# Patient Record
Sex: Female | Born: 1970 | Race: Black or African American | Hispanic: No | Marital: Married | Smoking: Never smoker
Health system: Southern US, Community
[De-identification: ages and names within clinical notes are randomized; demographics above are authoritative.]

---

## 2015-09-22 ENCOUNTER — Emergency Department (HOSPITAL_COMMUNITY): Payer: Self-pay

## 2015-09-22 ENCOUNTER — Emergency Department (HOSPITAL_COMMUNITY)
Admission: EM | Admit: 2015-09-22 | Discharge: 2015-09-22 | Disposition: A | Discharge status: Home / Self Care | Payer: Self-pay | Attending: Emergency Medicine | Admitting: Emergency Medicine

## 2015-09-22 ENCOUNTER — Encounter (HOSPITAL_COMMUNITY): Payer: Self-pay | Admitting: Emergency Medicine

## 2015-09-22 DIAGNOSIS — Y998 Other external cause status: Secondary | ICD-10-CM | POA: Insufficient documentation

## 2015-09-22 DIAGNOSIS — S9304XA Dislocation of right ankle joint, initial encounter: Secondary | ICD-10-CM | POA: Insufficient documentation

## 2015-09-22 DIAGNOSIS — Y9389 Activity, other specified: Secondary | ICD-10-CM | POA: Insufficient documentation

## 2015-09-22 DIAGNOSIS — S82891A Other fracture of right lower leg, initial encounter for closed fracture: Secondary | ICD-10-CM

## 2015-09-22 DIAGNOSIS — W11XXXA Fall on and from ladder, initial encounter: Secondary | ICD-10-CM | POA: Insufficient documentation

## 2015-09-22 DIAGNOSIS — Y9289 Other specified places as the place of occurrence of the external cause: Secondary | ICD-10-CM | POA: Insufficient documentation

## 2015-09-22 DIAGNOSIS — R52 Pain, unspecified: Secondary | ICD-10-CM

## 2015-09-22 MED ORDER — FENTANYL CITRATE (PF) 100 MCG/2ML IJ SOLN: 50.0000 ug | Freq: Once | INTRAMUSCULAR | Status: DC

## 2015-09-22 MED ORDER — FENTANYL CITRATE (PF) 100 MCG/2ML IJ SOLN
100.0000 ug | Freq: Once | INTRAMUSCULAR | Status: AC
Start: 2015-09-22 — End: 2015-09-22
  Administered 2015-09-22: 100 ug via INTRAVENOUS

## 2015-09-22 MED ORDER — OXYCODONE-ACETAMINOPHEN 5-325 MG PO TABS: 1.0000 | ORAL_TABLET | ORAL | Status: AC | PRN

## 2015-09-22 MED ORDER — FENTANYL CITRATE (PF) 100 MCG/2ML IJ SOLN
INTRAMUSCULAR | Status: AC
  Filled 2015-09-22: qty 2

## 2015-09-22 MED ORDER — HYDROMORPHONE HCL 1 MG/ML IJ SOLN
INTRAMUSCULAR | Status: AC
  Administered 2015-09-22: 1 mg via INTRAVENOUS
  Filled 2015-09-22: qty 1

## 2015-09-22 MED ORDER — KETAMINE HCL 10 MG/ML IJ SOLN
40.0000 mg | Freq: Once | INTRAMUSCULAR | Status: AC
  Administered 2015-09-22: 40 mg via INTRAVENOUS

## 2015-09-22 MED ORDER — ONDANSETRON HCL 4 MG/2ML IJ SOLN
4.0000 mg | Freq: Once | INTRAMUSCULAR | Status: AC
Start: 2015-09-22 — End: 2015-09-22
  Administered 2015-09-22: 4 mg via INTRAVENOUS
  Filled 2015-09-22: qty 2

## 2015-09-22 MED ORDER — HYDROMORPHONE HCL 1 MG/ML IJ SOLN
1.0000 mg | Freq: Once | INTRAMUSCULAR | Status: AC
  Administered 2015-09-22: 1 mg via INTRAVENOUS

## 2015-09-22 MED ORDER — PROPOFOL 10 MG/ML IV BOLUS
50.0000 mg | Freq: Once | INTRAVENOUS | Status: AC
  Administered 2015-09-22: 50 mg via INTRAVENOUS
  Filled 2015-09-22: qty 1

## 2015-09-22 NOTE — ED Notes (Addendum)
Pt had a brief moment of apnea.  Ventilation assisted w/ ambu-bag.

## 2015-09-22 NOTE — ED Notes (Signed)
Bed: [id number] Expected date:  Expected time:  Means of arrival:  Comments: hgb low

## 2015-09-22 NOTE — ED Notes (Signed)
Patient d/c'd to home.  F/U and medications reviewed.  Patient verbalized understanding.

## 2015-09-22 NOTE — ED Notes (Signed)
Patient given water to drink.  

## 2015-09-22 NOTE — ED Notes (Signed)
Ketamine given to [place], [employer].

## 2015-09-22 NOTE — ED Provider Notes (Signed)
CSN: [id number]     Arrival date & time 09/22/15  1443 History   First MD Initiated Contact with Patient 09/22/15 1455     Chief Complaint  Patient presents with  . 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 Just prior to arrival, the patient was 3 steps up on a stepladder and fell back, landing on her right ankle. She had a sudden onset of right ankle pain associated with obvious right ankle deformity. She endorses normal sensation of her right foot. She denies hitting her head or sustaining any other injuries. Her pain is severe, constant, and worse with any movement.  Patient is a 44 y.o. female presenting with lower extremity injury. The history is provided by the patient.  Ankle Injury    History reviewed. No pertinent past medical history. History reviewed. No pertinent past surgical history. No family history on file. Social History  Substance Use Topics  . Smoking status: None  . Smokeless tobacco: None  . Alcohol Use: None   OB History    No data available     Review of Systems 10 Systems reviewed and are negative for acute change except as noted in the HPI.    Allergies  Review of patient's allergies indicates no known allergies.  Home Medications   Prior to Admission medications   Medication Sig Start Date End Date Taking? Authorizing Provider  ibuprofen (ADVIL,MOTRIN) 200 MG tablet Take 400 mg by mouth every 6 (six) hours as needed for headache, mild pain or moderate pain.   Yes Historical Provider, MD  oxyCODONE-acetaminophen (PERCOCET) 5-325 MG tablet Take 1-2 tablets by mouth every 4 (four) hours as needed. 09/22/15   Ambrose Finland Rape, MD   BP 161/94 mmHg  Pulse 84  Temp(Src) 97.9 F (36.6 C) (Oral)  Resp 19  Ht  (1.6 m)  Wt 169 lb (76.658 kg)  BMI 29.94 kg/m2  SpO2 99%  LMP  09/15/2015 (Approximate) Physical Exam  Constitutional: She is oriented to person, place, and time. She appears well-developed and well-nourished.  In distress due to pain  HENT:  Head: Normocephalic and atraumatic.  Moist mucous membranes  Eyes: Conjunctivae are normal. Pupils are equal, round, and reactive to light.  Neck: Neck supple.  Cardiovascular: Normal rate, regular rhythm and normal heart sounds.   No murmur heard. Pulmonary/Chest: Effort normal and breath sounds normal.  Abdominal: Soft. Bowel sounds are normal. She exhibits no distension. There is no tenderness.  Musculoskeletal:  R foot deviated laterally in comparison to R leg w/ closed dislocation; 2+ pedal pulses; normal sensation b/l feet  Neurological: She is alert and oriented to person, place, and time.  Fluent speech  Skin: Skin is warm and dry. No pallor.  Psychiatric: She has a normal mood and affect. Judgment normal.  Nursing note and vitals reviewed.   ED Course  .Sedation Date/Time: 09/22/2015 4:26 PM Performed by: Laurence Spates Authorized by: Laurence Spates  Consent:    Consent obtained:  Written   Consent given by:  Patient   Risks discussed:  Allergic reaction, inadequate sedation, respiratory compromise necessitating ventilatory assistance and intubation, prolonged hypoxia resulting in organ damage and prolonged sedation necessitating reversal   Alternatives discussed:  Analgesia without sedation Universal protocol:    Procedure explained and questions answered to patient or proxy's satisfaction: yes     Relevant documents present and verified:  yes     Imaging studies available: yes     Site/side marked: yes     Immediately prior to procedure a time out was called: yes     Patient identity confirmation method:  Arm band and verbally with patient Indications:    Sedation purpose:  Dislocation reduction   Procedure necessitating sedation performed by:  Physician performing sedation    Intended level of sedation:  Moderate (conscious sedation) Pre-sedation assessment:    NPO status caution: urgency dictates proceeding with non-ideal NPO status     ASA classification: class 1 - normal, healthy patient     Neck mobility: normal     Mouth opening:  3 or more finger widths   Thyromental distance:  4 finger widths   Mallampati score:  II - soft palate, uvula, fauces visible   Pre-sedation assessments completed and reviewed: airway patency not reviewed, mental status not reviewed and nausea/vomiting status not reviewed   Immediate pre-procedure details:    Reassessment: Patient reassessed immediately prior to procedure     Reviewed: vital signs     Verified: bag valve mask available, emergency equipment available, intubation equipment available, IV patency confirmed, oxygen available and suction available   Procedure details (see MAR for exact dosages):    Preoxygenation:  Nasal cannula   Sedation:  Ketamine and propofol   Intra-procedure monitoring:  Blood pressure monitoring, cardiac monitor, continuous capnometry and continuous pulse oximetry   Intra-procedure events: hypoxia and respiratory depression     Intra-procedure management:  Airway repositioning, BVM ventilation and supplemental oxygen   Sedation end time:  09/22/2015 4:35 PM   Total sedation time (minutes):  10 Post-procedure details:    Post-sedation assessment completed:  09/22/2015 4:41 PM   Attendance: Constant attendance by certified staff until patient recovered     Recovery: Patient returned to pre-procedure baseline     Post-sedation assessments completed and reviewed: airway patency not reviewed and cardiovascular function not reviewed     Patient is stable for discharge or admission: Yes     Patient tolerance:  Tolerated well, no immediate complications Comments:     After reduction, patient had period of apnea followed by hypoxia requiring assisted ventilation by BVM for 2-3 minutes, after which  patient spontaneously awoke and began taking spontaneous respirations  Reduction of dislocation Date/Time: 4:30pm Performed by: Whitney Plunkett and Rachel Kidane Authorized by: Ambrose Finland Fortune Consent: written consent obtained. Risks and benefits: risks, benefits and alternatives were discussed Consent given by: patient Required items: required blood products, implants, devices, and special equipment available Time out: Immediately prior to procedure a "time out" was called to verify the correct patient, procedure, equipment, support staff and site/side marked as required.  Patient sedated: Ketamine and propofol  Vitals: Vital signs were monitored during sedation. Patient tolerance: Patient tolerated the procedure well with no immediate complications. Joint: left ankle Reduction technique: traction/countertraction Pulses present and normal sensation confirmed post-reduction.     (including critical care time) Labs Review Labs Reviewed - No data to display  Imaging Review Dg Knee 1-2 Views Right  09/22/2015  CLINICAL DATA:  Fall.  Initial evaluation. EXAM: RIGHT KNEE - 1-2 VIEW COMPARISON:  No prior. FINDINGS: No acute bony or joint abnormality identified. No evidence fracture or dislocation. IMPRESSION: No acute abnormality. Electronically Signed   By: Maisie Fus  Register   On: 09/22/2015 15:47   Dg Tibia/fibula Right  09/22/2015  CLINICAL DATA:  Pt was at work on a ladder and fell, twisting right  ankle. Pt complains of pain from knee to foot. Pt has a obvious deformity. Right knee looks AP and lower leg is lateral. Best images obtained due to pt condition. Shielded pt. EXAM: RIGHT TIBIA AND FIBULA - 2 VIEW COMPARISON:  Right foot and ankle same day FINDINGS: There is an oblique fracture of the distal fibula associated with dislocation of the ankle. The talus is posteriorly dislocated relative to the distal tibia. No definite fracture of the tibia identified. There is rotational  component of the dislocation. The inner osseous membrane is likely disrupted. IMPRESSION: Fracture dislocation of the ankle. Electronically Signed   By: Norva Pavlov M.D.   On: 09/22/2015 15:46   Dg Ankle 2 Views Right  09/22/2015  CLINICAL DATA:  Fall from ladder, twisting right ankle.  Deformity. EXAM: RIGHT ANKLE - 2 VIEW COMPARISON:  None. FINDINGS: There is an oblique fracture through the distal right fibula. Dislocation of the tibia anteriorly relative to the talus. No visible tibial fracture. Soft tissue swelling. IMPRESSION: Distal fibular fracture.  Tibiotalar dislocation. Electronically Signed   By: Charlett Nose M.D.   On: 09/22/2015 15:45   Dg Ankle Right Port  09/22/2015  CLINICAL DATA:  Postreduction right ankle. EXAM: PORTABLE RIGHT ANKLE - 2 VIEW COMPARISON:  09/22/2015 FINDINGS: Patient has undergone reduction of the right ankle. There is widening of the medial malleolus. Again noted is the lateral malleolar fracture. No evidence for fracture of the tibia. IMPRESSION: 1. Interval reduction of fracture dislocation. 2. Ankle mortise is widened along the medial aspect. Electronically Signed   By: Norva Pavlov M.D.   On: 09/22/2015 17:08   Dg Foot 2 Views Right  09/22/2015  CLINICAL DATA:  Pt was at work on a ladder and fell, twisting right ankle. Pt complains of pain from knee to foot. Pt has a obvious deformity. Right knee looks AP and lower leg is lateral. Best images obtained due to pt condition. Shielded pt. EXAM: RIGHT FOOT - 2 VIEW COMPARISON:  None. FINDINGS: Two views are performed, showing fracture dislocation of the ankle. Lateral malleolus is obliquely fractured. There is dislocation of the tibiotalar joint fell obvious fracture of the distal tibia. Positioning of foot is nonstandard because of deformity. There is no evidence for fracture of the foot. IMPRESSION: Fracture dislocation of the ankle. Electronically Signed   By: Norva Pavlov M.D.   On: 09/22/2015 15:44   I  have personally reviewed and evaluated these images and lab results as part of my medical decision-making.   EKG Interpretation None     Medications  ondansetron (ZOFRAN) injection 4 mg (4 mg Intravenous Given 09/22/15 1505)  HYDROmorphone (DILAUDID) injection 1 mg (1 mg Intravenous Given 09/22/15 1505)  ketamine (KETALAR) injection 40 mg (40 mg Intravenous Given by Other 09/22/15 1628)  propofol (DIPRIVAN) 10 mg/mL bolus/IV push 50 mg (0 mg Intravenous Stopped 09/22/15 1630)  fentaNYL (SUBLIMAZE) injection 100 mcg (100 mcg Intravenous Given 09/22/15 1625)     MDM   Final diagnoses:  Fracture dislocation of right ankle joint, closed, initial encounter   43yo F w/ R ankle injury from fall off stepladder. Pt arrived in moderate distress w/ obvious R ankle deformity suggestive of closed dislocation. Gave the patient Dilaudid and Zofran and obtained plain films which confirmed tibiotalar dislocation with distal fibular fracture. Performed reduction at bedside under procedural sedation; see procedure note for details. The patient had a 2-3 minute period of apnea requiring assisted ventilation with BVM after which she began breathing  spontaneously. Obtained postreduction films which showed distal fibula fx but appropriate relocation. I spoke with the orthopedic surgeon on call, Dr. Lequita HaltAluisio, who recommended 2-3 day follow up. Pt neurovascularly intact after reduction. I discussed care instructions w/ patient and her boyfriend including elevation, pain control, and strict non-weightbearing. Reviewed return precautions including signs of neurovascular compromise. Pt to see orthopaedics in 2-3 days. She voiced understanding and was discharged in satisfactory condition.  Laurence Spatesachel Morgan Geraldo, MD 09/23/15 [phone]

## 2015-09-22 NOTE — ED Notes (Signed)
Pt states she was on ladder and fell, landing on right ankle. Pt has obvious deformity to right ankle.

## 2015-09-22 NOTE — ED Notes (Signed)
X-ray at bedside

## 2016-10-07 IMAGING — DX DG ANKLE PORT 2V*R*
2 series · 2 of 2 positions shown · non-contrast
Comparison: 09/22/2015

CLINICAL DATA: Postreduction right ankle.

EXAM:
PORTABLE RIGHT ANKLE - 2 VIEW

[ankle lat]
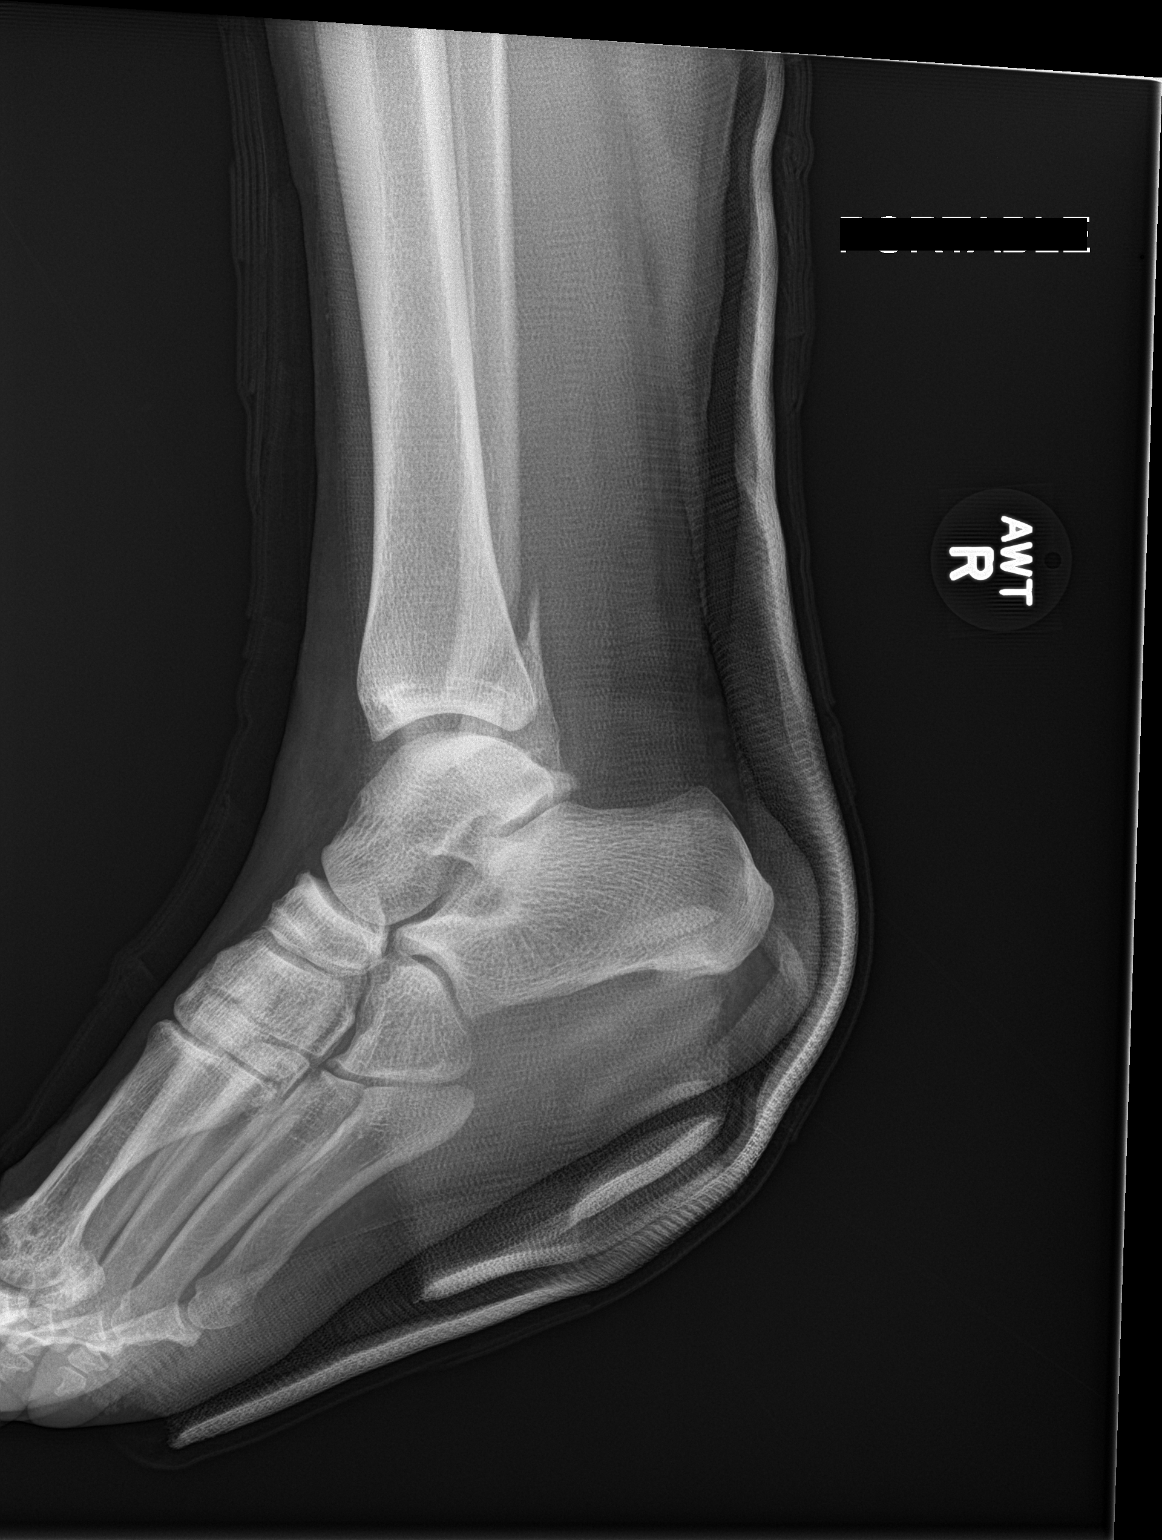

[ankle ap]
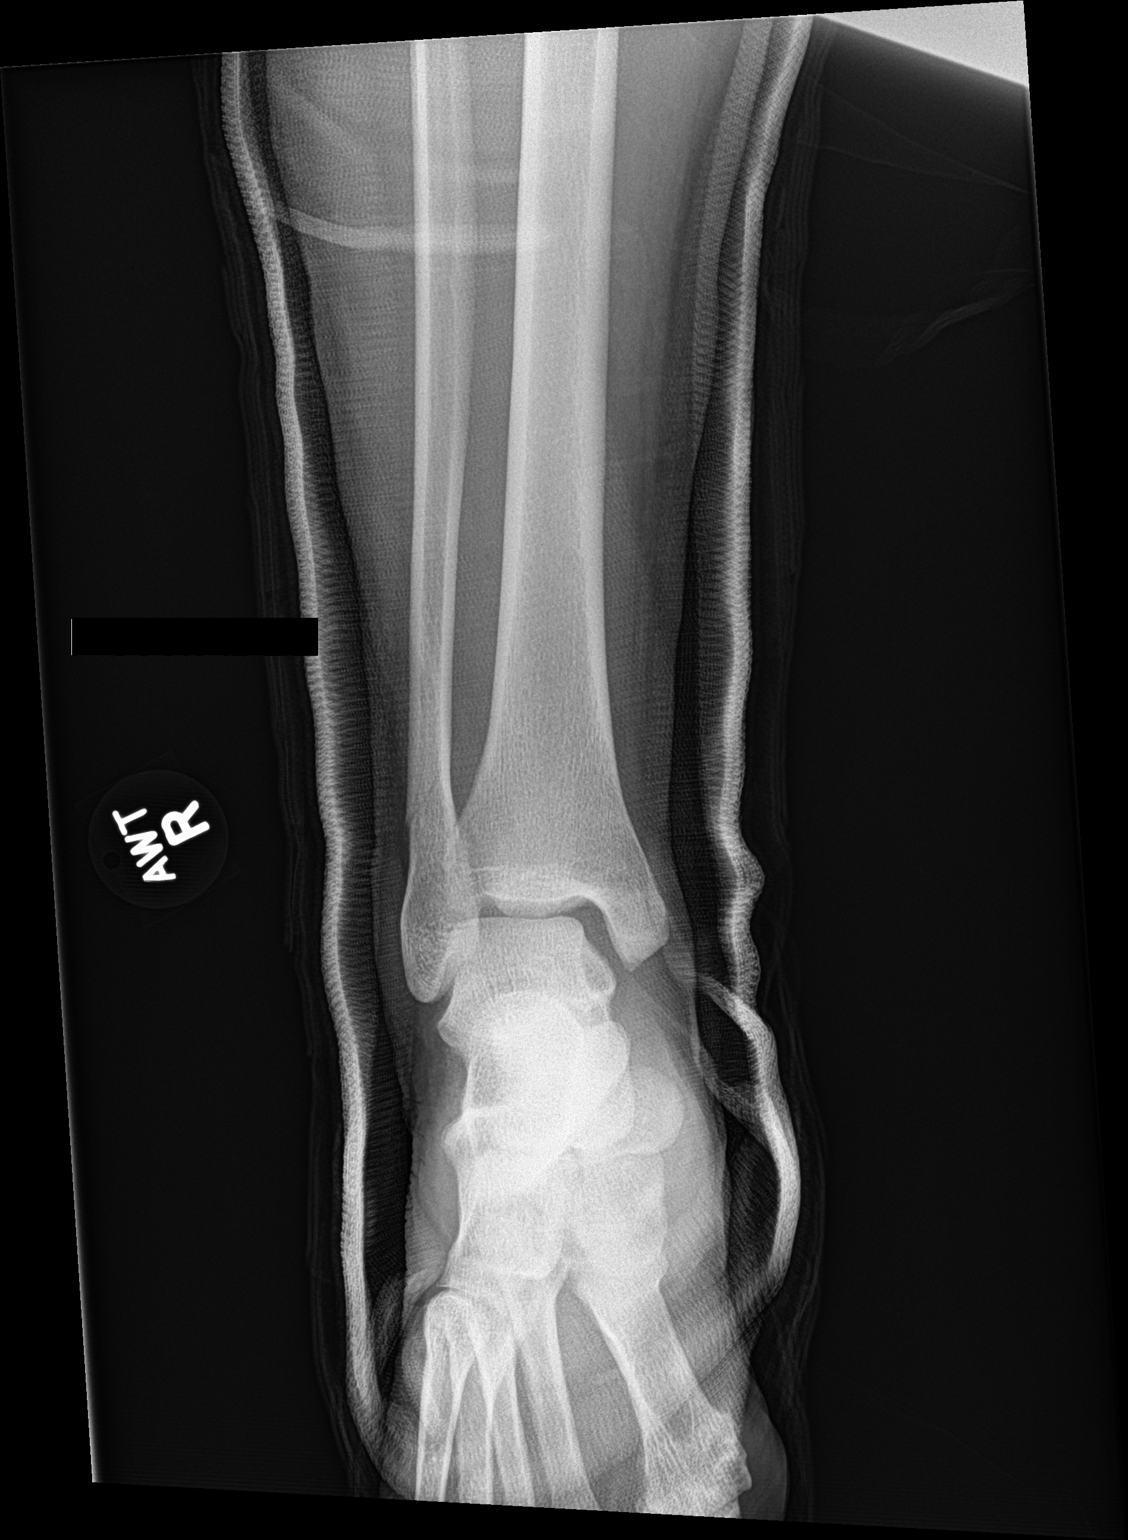

[2 of 2 positions shown; findings below may reference images not displayed]

FINDINGS: Patient has undergone reduction of the right ankle. There is
widening of the medial malleolus. Again noted is the lateral
malleolar fracture. No evidence for fracture of the tibia.
IMPRESSION: 1. Interval reduction of fracture dislocation.
2. Ankle mortise is widened along the medial aspect.

## 2016-10-07 IMAGING — CR DG ANKLE 2V *R*
2 series · 2 of 2 positions shown · non-contrast
Comparison: None.

CLINICAL DATA: Fall from ladder, twisting right ankle.  Deformity.

EXAM:
RIGHT ANKLE - 2 VIEW

[x ankle lat right]
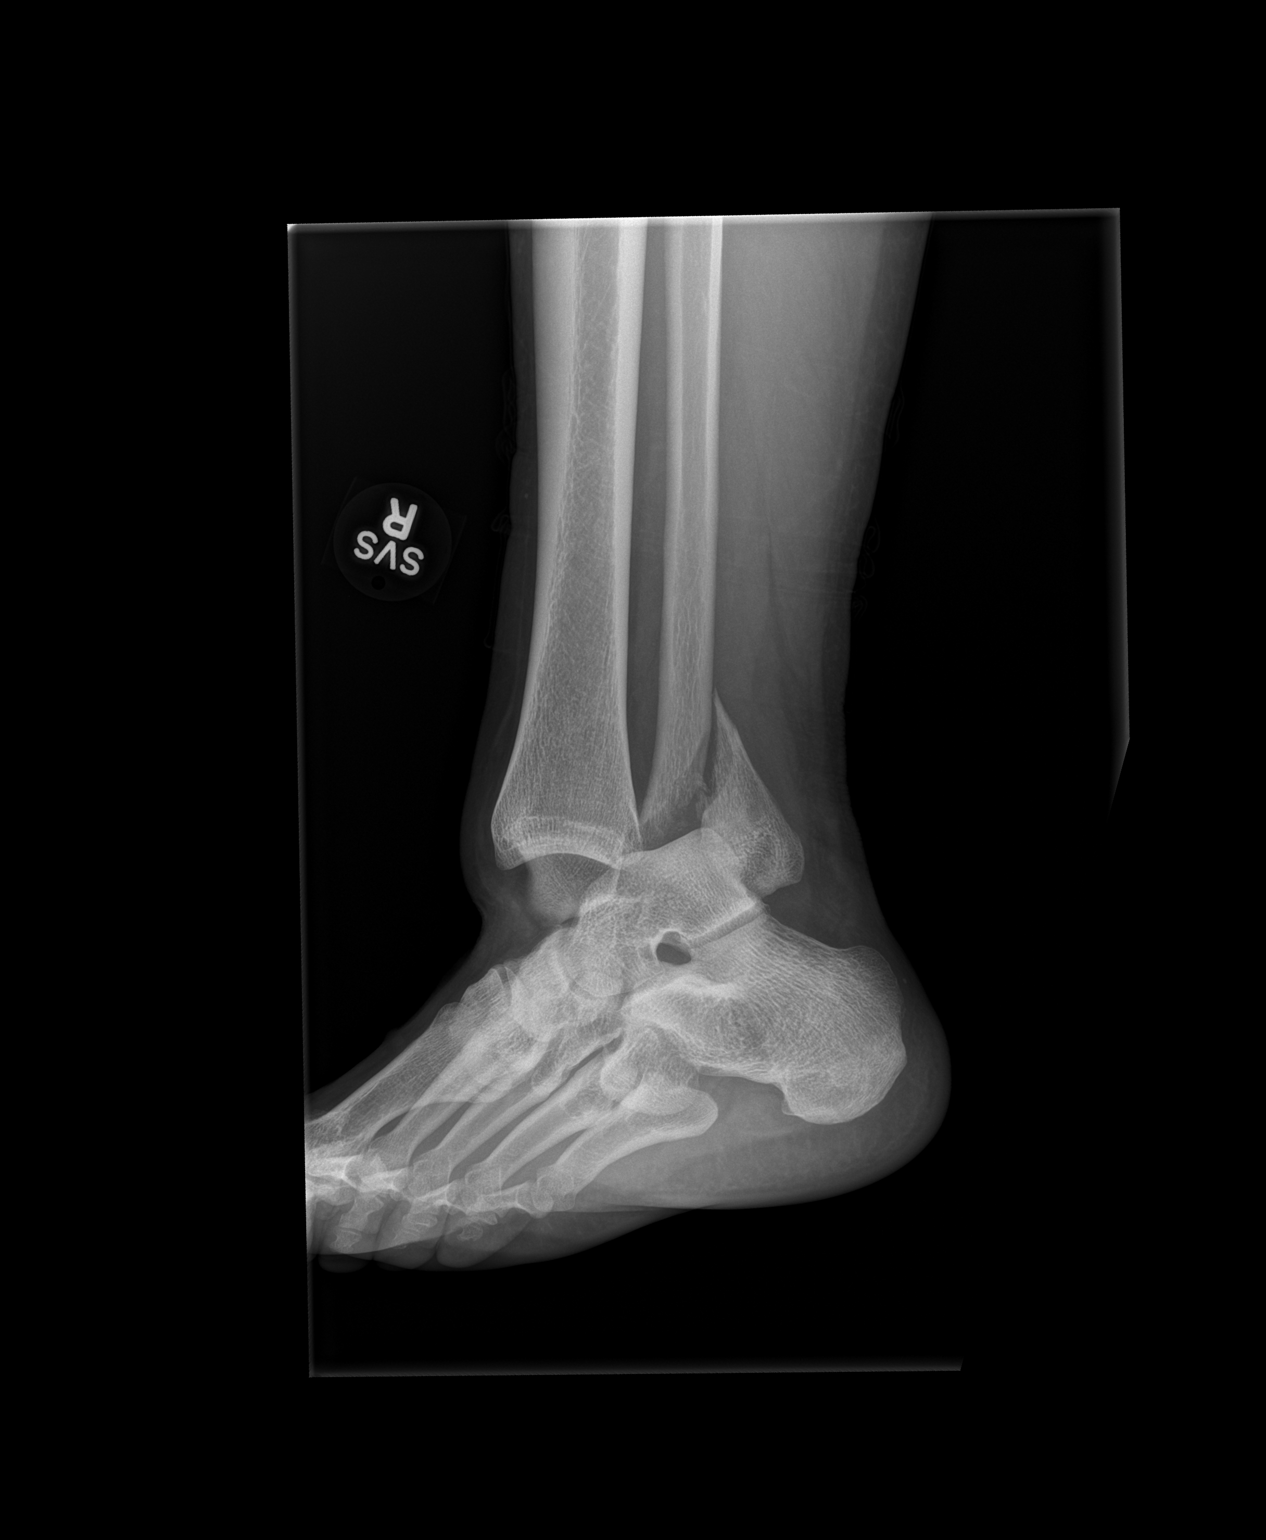

[x ankle ap right]
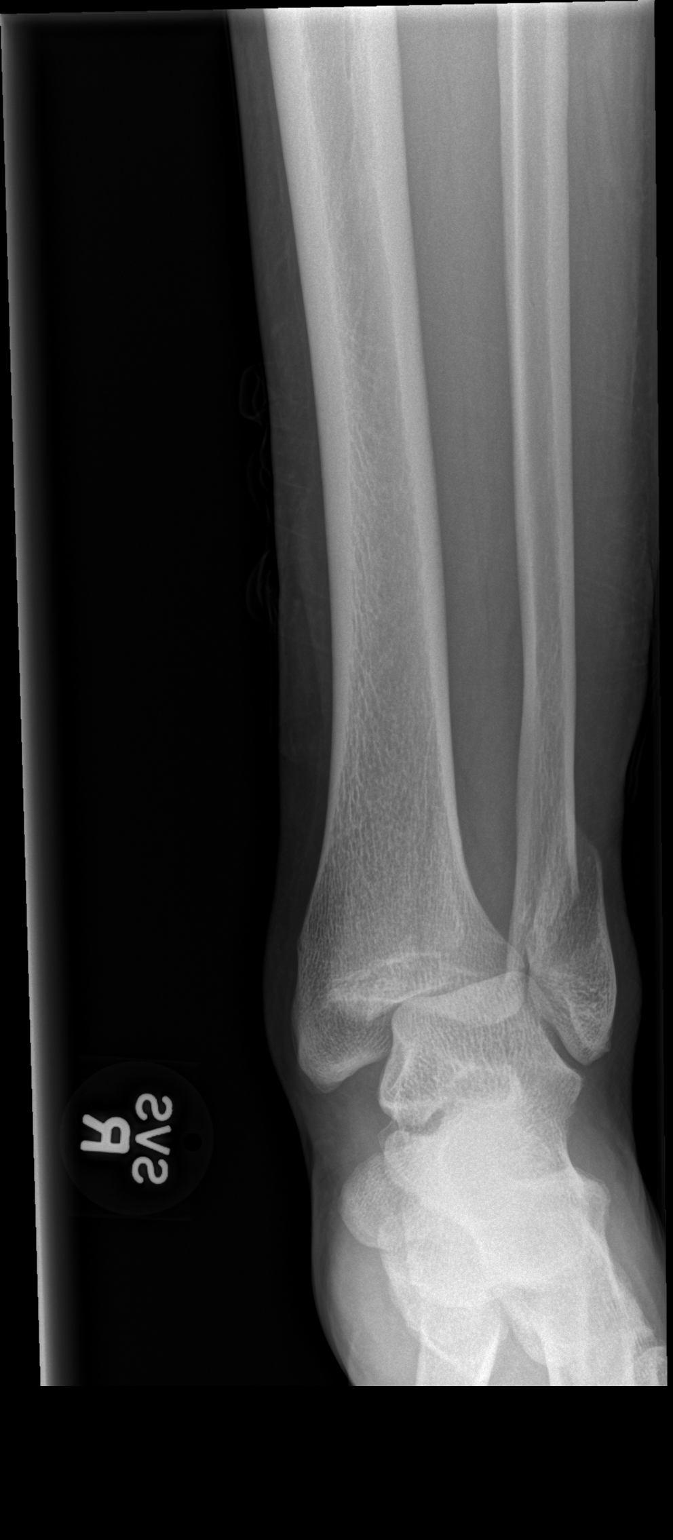

[2 of 2 positions shown; findings below may reference images not displayed]

FINDINGS: There is an oblique fracture through the distal right fibula.
Dislocation of the tibia anteriorly relative to the talus. No
visible tibial fracture. Soft tissue swelling.
IMPRESSION: Distal fibular fracture.  Tibiotalar dislocation.

## 2016-10-07 IMAGING — CR DG KNEE 1-2V*R*
2 series · 2 of 2 positions shown · non-contrast
Comparison: No prior.

CLINICAL DATA: Fall.  Initial evaluation.

EXAM:
RIGHT KNEE - 1-2 VIEW

[x knee ap right]
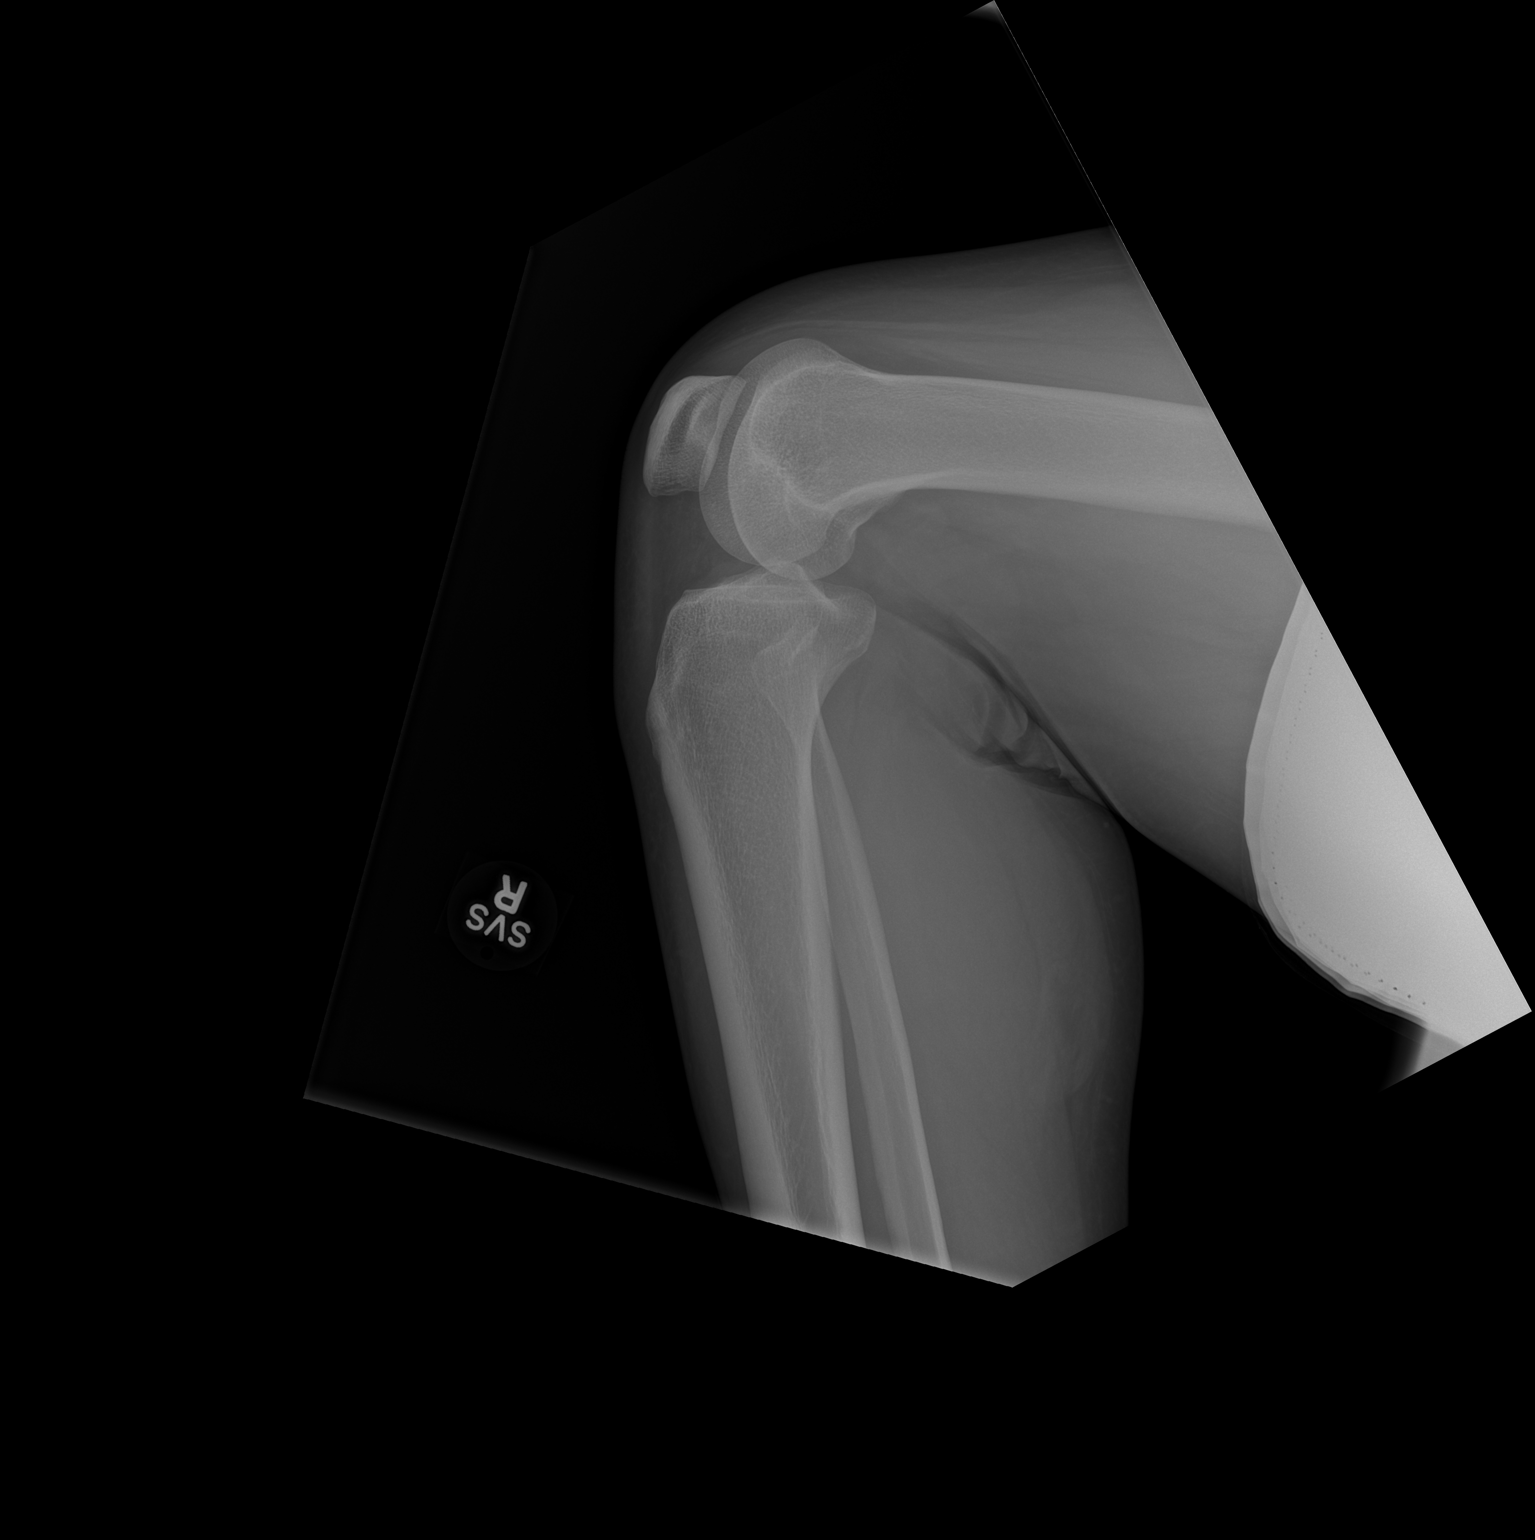

[x knee lat right]
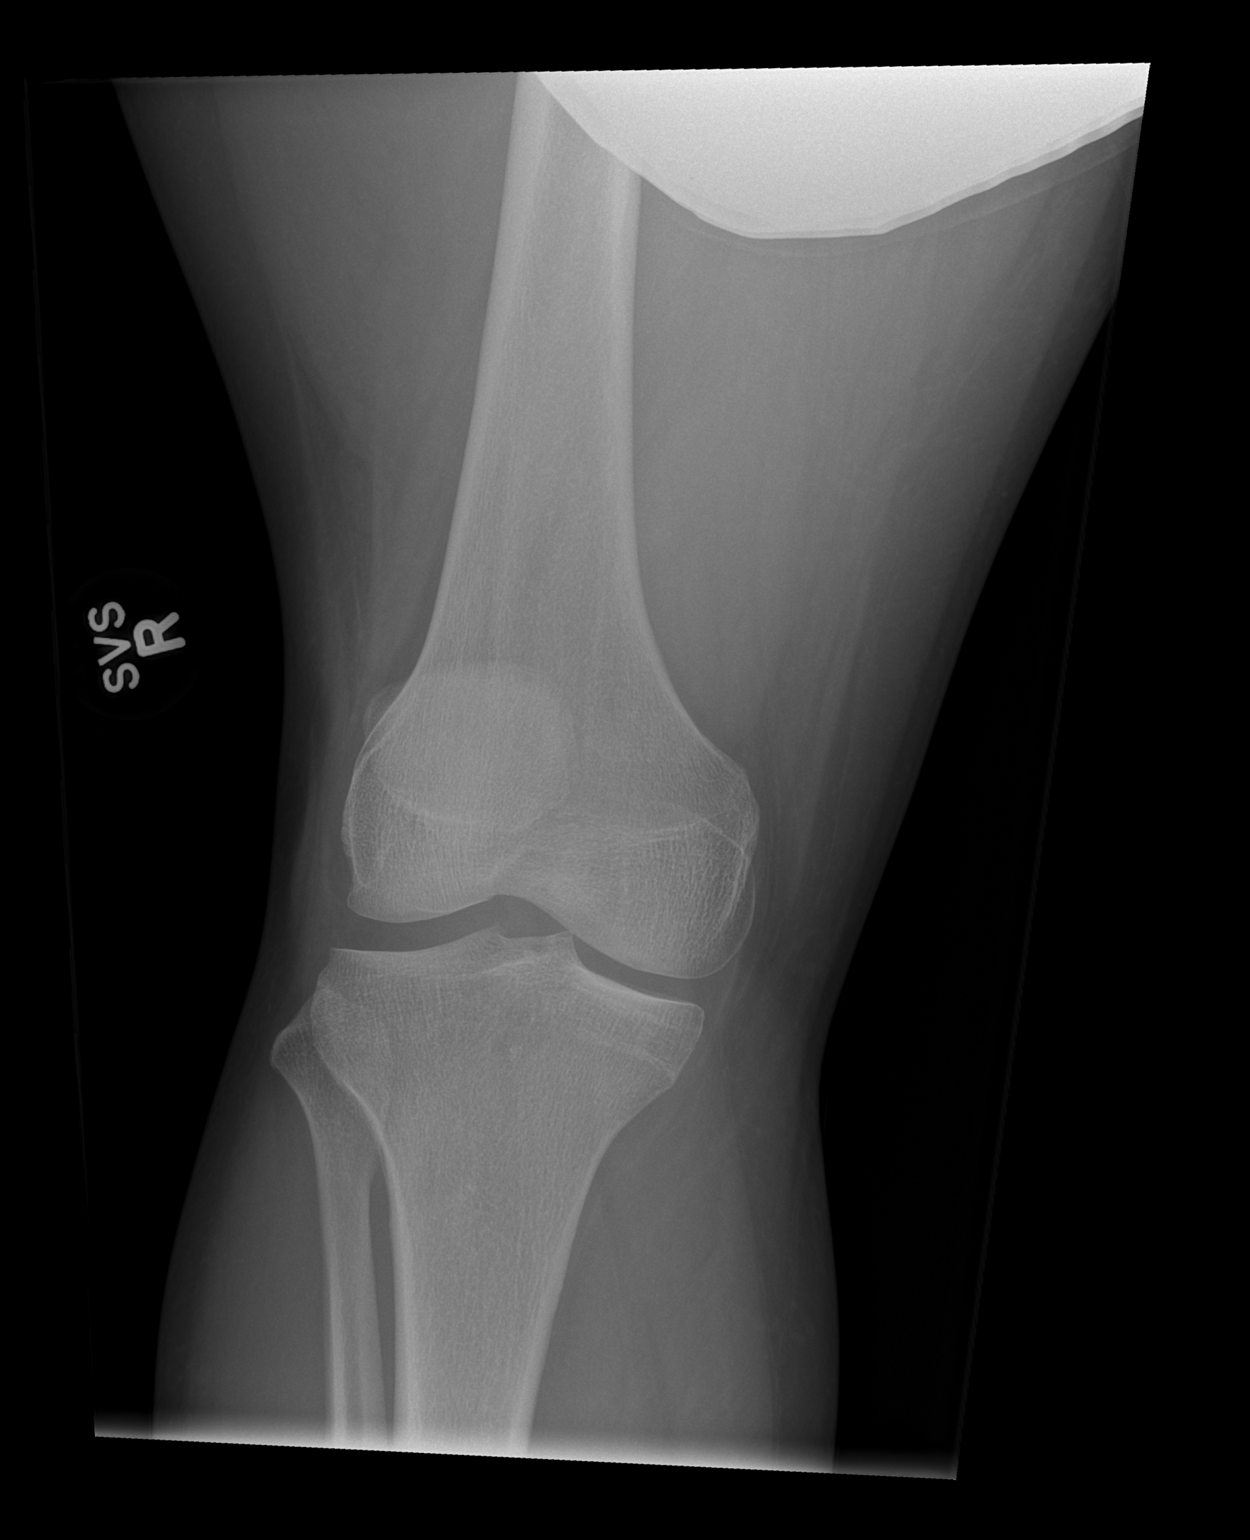

[2 of 2 positions shown; findings below may reference images not displayed]

FINDINGS: No acute bony or joint abnormality identified. No evidence fracture
or dislocation.
IMPRESSION: No acute abnormality.

## 2019-12-01 ENCOUNTER — Emergency Department (HOSPITAL_COMMUNITY)
Admission: EM | Admit: 2019-12-01 | Discharge: 2019-12-01 | Disposition: A | Discharge status: Home / Self Care | Payer: Self-pay | Attending: Emergency Medicine | Admitting: Emergency Medicine

## 2019-12-01 ENCOUNTER — Encounter (HOSPITAL_COMMUNITY): Payer: Self-pay

## 2019-12-01 ENCOUNTER — Emergency Department (HOSPITAL_COMMUNITY): Payer: Self-pay

## 2019-12-01 ENCOUNTER — Other Ambulatory Visit: Payer: Self-pay

## 2019-12-01 DIAGNOSIS — N83201 Unspecified ovarian cyst, right side: Secondary | ICD-10-CM | POA: Insufficient documentation

## 2019-12-01 DIAGNOSIS — R102 Pelvic and perineal pain: Secondary | ICD-10-CM

## 2019-12-01 LAB — CBC WITH DIFFERENTIAL/PLATELET
Abs Immature Granulocytes: 0.04 10*3/uL (ref 0.00–0.07)
Basophils Absolute: 0 10*3/uL (ref 0.0–0.1)
Basophils Relative: 0 %
Eosinophils Absolute: 0.1 10*3/uL (ref 0.0–0.5)
Eosinophils Relative: 1 %
HCT: 41 % (ref 36.0–46.0)
Hemoglobin: 13.2 g/dL (ref 12.0–15.0)
Immature Granulocytes: 1 %
Lymphocytes Relative: 23 %
Lymphs Abs: 1.6 10*3/uL (ref 0.7–4.0)
MCH: 30.3 pg (ref 26.0–34.0)
MCHC: 32.2 g/dL (ref 30.0–36.0)
MCV: 94.3 fL (ref 80.0–100.0)
Monocytes Absolute: 0.6 10*3/uL (ref 0.1–1.0)
Monocytes Relative: 9 %
Neutro Abs: 4.6 10*3/uL (ref 1.7–7.7)
Neutrophils Relative %: 66 %
Platelets: 350 10*3/uL (ref 150–400)
RBC: 4.35 MIL/uL (ref 3.87–5.11)
RDW: 13.2 % (ref 11.5–15.5)
WBC: 6.9 10*3/uL (ref 4.0–10.5)
nRBC: 0 % (ref 0.0–0.2)

## 2019-12-01 LAB — BASIC METABOLIC PANEL
Anion gap: 9 (ref 5–15)
BUN: 15 mg/dL (ref 6–20)
CO2: 25 mmol/L (ref 22–32)
Calcium: 9.4 mg/dL (ref 8.9–10.3)
Chloride: 104 mmol/L (ref 98–111)
Creatinine, Ser: 0.71 mg/dL (ref 0.44–1.00)
GFR calc Af Amer: >60 mL/min (ref >60)
GFR calc non Af Amer: >60 mL/min (ref >60)
Glucose, Bld: 102 mg/dL — ABNORMAL HIGH (ref 70–99)
Potassium: 4 mmol/L (ref 3.5–5.1)
Sodium: 138 mmol/L (ref 135–145)

## 2019-12-01 LAB — I-STAT BETA HCG BLOOD, ED (MC, WL, AP ONLY): I-stat hCG, quantitative: <5 m[IU]/mL (ref <5)

## 2019-12-01 MED ORDER — KETOROLAC TROMETHAMINE 30 MG/ML IJ SOLN
30.0000 mg | Freq: Once | INTRAMUSCULAR | Status: AC
  Administered 2019-12-01: 30 mg via INTRAVENOUS
  Filled 2019-12-01: qty 1

## 2019-12-01 MED ORDER — SODIUM CHLORIDE 0.9 % IV BOLUS
1000.0000 mL | Freq: Once | INTRAVENOUS | Status: AC
  Administered 2019-12-01: 1000 mL via INTRAVENOUS

## 2019-12-01 NOTE — ED Provider Notes (Signed)
[facility] Provider Note   CSN: [id number] Arrival date & time: 12/01/19  5638     History Chief Complaint  Patient presents with  . Abdominal Pain  . Nausea    Wendy Ward is a 49 y.o. female.  Patient is a 49 year old female with no significant past medical history.  She presents today with complaints of right lower quadrant pain.  This is been ongoing for the past week.  She has been seen at [facility] and [facility] for the same complaint.  She has had both an ultrasound and CT scan performed which revealed a right-sided ovarian cyst, but no other acute pathology.  She also had pelvic examination which she was informed was normal and that she has no STDs.  Patient here for work when she developed sudden onset of severe right lower quadrant pain.  She denies any bleeding.  She denies any fevers or chills.  She denies any urinary complaints.  The history is provided by the patient.  Abdominal Pain Pain location:  R flank Pain quality: stabbing   Pain radiates to:  Does not radiate Pain severity:  Severe Onset quality:  Sudden Timing:  Intermittent Progression:  Worsening Chronicity:  New Relieved by:  Nothing Worsened by:  Nothing      History reviewed. No pertinent past medical history.  There are no problems to display for this patient.   History reviewed. No pertinent surgical history.   OB History   No obstetric history on file.     History reviewed. No pertinent family history.  Social History   Tobacco Use  . Smoking status: Never Smoker  . Smokeless tobacco: Never Used  Substance Use Topics  . Alcohol use: Not on file  . Drug use: Not on file    Home Medications Prior to Admission medications   Medication Sig Start Date End Date Taking? Authorizing Provider  ibuprofen (ADVIL,MOTRIN) 200 MG tablet Take 400 mg by mouth every 6 (six) hours as needed for headache, mild pain or moderate pain.    [provider]  oxyCODONE-acetaminophen (PERCOCET) 5-325 MG tablet Take 1-2 tablets by mouth every 4 (four) hours as needed. Patient not taking: Reported on 12/01/2019 09/22/15   Funderburke, Ambrose Finland, MD    Allergies    Patient has no known allergies.  Review of Systems   Review of Systems  Gastrointestinal: Positive for abdominal pain.  All other systems reviewed and are negative.   Physical Exam Updated Vital Signs BP (!) 174/94   Pulse 73   Temp 98.3 F (36.8 C) (Oral)   Resp 16   Ht 5\' 2"  (1.575 m)   Wt 95.3 kg   SpO2 100%   BMI 38.41 kg/m   Physical Exam Vitals and nursing note reviewed.  Constitutional:      General: She is not in acute distress.    Appearance: She is well-developed. She is not diaphoretic.  HENT:     Head: Normocephalic and atraumatic.  Cardiovascular:     Rate and Rhythm: Normal rate and regular rhythm.     Heart sounds: No murmur. No friction rub. No gallop.   Pulmonary:     Effort: Pulmonary effort is normal. No respiratory distress.     Breath sounds: Normal breath sounds. No wheezing.  Abdominal:     General: Bowel sounds are normal. There is no distension.     Palpations: Abdomen is soft.     Tenderness: There is abdominal tenderness in  the right lower quadrant. There is no right CVA tenderness, left CVA tenderness, guarding or rebound.  Musculoskeletal:        General: Normal range of motion.     Cervical back: Normal range of motion and neck supple.  Skin:    General: Skin is warm and dry.  Neurological:     Mental Status: She is alert and oriented to person, place, and time.     ED Results / Procedures / Treatments   Labs (all labs ordered are listed, but only abnormal results are displayed) Labs Reviewed  BASIC METABOLIC PANEL  CBC WITH DIFFERENTIAL/PLATELET  URINALYSIS, ROUTINE W REFLEX MICROSCOPIC  I-STAT BETA HCG BLOOD, ED ([facility], [facility], [facility] ONLY)    EKG None  Radiology No results found.  Procedures Procedures  (including critical care time)  Medications Ordered in ED Medications  sodium chloride 0.9 % bolus 1,000 mL (has no administration in time range)  ketorolac (TORADOL) 30 MG/ML injection 30 mg (has no administration in time range)    ED Course  I have reviewed the triage vital signs and the nursing notes.  Pertinent labs & imaging results that were available during my care of the patient were reviewed by me and considered in my medical decision making (see chart for details).    MDM Rules/Calculators/A&P  Patient is a 49 year old female with past medical history of ovarian cysts recently diagnosed in [place].  She presents today with complaints of sudden onset of pain to the right lower abdomen radiating into her back.  Patient's blood counts are unremarkable and ultrasound of the pelvis reveals ovarian cyst with no evidence for rupture.  There is good blood flow through the ovary thus ruling out torsion.  At this point, I feel as though patient is appropriate for discharge.  She is to follow-up with her GYN in the next few days.  Final Clinical Impression(s) / ED Diagnoses Final diagnoses:  Pelvic pain    Rx / DC Orders ED Discharge Orders    None       Veryl Speak, MD 12/01/19 1337

## 2019-12-01 NOTE — Discharge Instructions (Addendum)
Continue medications as previously prescribed.  Follow-up with your gynecologist in the next few days, and return to the ER for worsening pain, high fever, or other new and concerning symptoms.

## 2019-12-01 NOTE — ED Notes (Signed)
Pt placed on purewick. Nurse aware.

## 2019-12-01 NOTE — ED Triage Notes (Signed)
EMS reports coming from work, Pt states Dx with Ovarian cyst last Sunday, c/o severe generalized abdominal pain radiating to back with nausea starting this morning. EMS reports hypertensive on scene.  BP 170/111 HR 74 RR 18 Sp02 100 RA CBG 115 Temp 98.0  4mg  Zofran IM enroute
